# Patient Record
Sex: Female | Born: 1995 | Race: White | Hispanic: No | Marital: Single | Smoking: Never smoker
Health system: Southern US, Community
[De-identification: ages and names within clinical notes are randomized; demographics above are authoritative.]

---

## 2016-11-23 ENCOUNTER — Encounter (HOSPITAL_COMMUNITY): Payer: Self-pay | Admitting: Emergency Medicine

## 2016-11-23 ENCOUNTER — Emergency Department (HOSPITAL_COMMUNITY): Payer: BLUE CROSS/BLUE SHIELD

## 2016-11-23 ENCOUNTER — Emergency Department (HOSPITAL_COMMUNITY)
Admission: EM | Admit: 2016-11-23 | Discharge: 2016-11-24 | Disposition: A | Discharge status: Home / Self Care | Payer: BLUE CROSS/BLUE SHIELD | Attending: Emergency Medicine | Admitting: Emergency Medicine

## 2016-11-23 DIAGNOSIS — J189 Pneumonia, unspecified organism: Secondary | ICD-10-CM

## 2016-11-23 DIAGNOSIS — Z79899 Other long term (current) drug therapy: Secondary | ICD-10-CM | POA: Insufficient documentation

## 2016-11-23 DIAGNOSIS — J181 Lobar pneumonia, unspecified organism: Secondary | ICD-10-CM | POA: Diagnosis not present

## 2016-11-23 DIAGNOSIS — J111 Influenza due to unidentified influenza virus with other respiratory manifestations: Secondary | ICD-10-CM | POA: Insufficient documentation

## 2016-11-23 DIAGNOSIS — R509 Fever, unspecified: Secondary | ICD-10-CM | POA: Diagnosis present

## 2016-11-23 DIAGNOSIS — Z5181 Encounter for therapeutic drug level monitoring: Secondary | ICD-10-CM | POA: Diagnosis not present

## 2016-11-23 DIAGNOSIS — Z7982 Long term (current) use of aspirin: Secondary | ICD-10-CM | POA: Diagnosis not present

## 2016-11-23 DIAGNOSIS — R69 Illness, unspecified: Secondary | ICD-10-CM

## 2016-11-23 LAB — PROTIME-INR
INR: 1.05
PROTHROMBIN TIME: 13.7 s (ref 11.4–15.2)

## 2016-11-23 LAB — CBC WITH DIFFERENTIAL/PLATELET
BASOS PCT: 0 %
Basophils Absolute: 0 10*3/uL (ref 0.0–0.1)
EOS ABS: 0 10*3/uL (ref 0.0–0.7)
Eosinophils Relative: 0 %
HCT: 38.9 % (ref 36.0–46.0)
Hemoglobin: 13.2 g/dL (ref 12.0–15.0)
LYMPHS ABS: 0.5 10*3/uL — AB (ref 0.7–4.0)
Lymphocytes Relative: 8 %
MCH: 27.5 pg (ref 26.0–34.0)
MCHC: 33.9 g/dL (ref 30.0–36.0)
MCV: 81 fL (ref 78.0–100.0)
MONO ABS: 0.5 10*3/uL (ref 0.1–1.0)
MONOS PCT: 10 %
Neutro Abs: 4.5 10*3/uL (ref 1.7–7.7)
Neutrophils Relative %: 82 %
Platelets: 261 10*3/uL (ref 150–400)
RBC: 4.8 MIL/uL (ref 3.87–5.11)
RDW: 13.5 % (ref 11.5–15.5)
WBC: 5.5 10*3/uL (ref 4.0–10.5)

## 2016-11-23 LAB — POC URINE PREG, ED: Preg Test, Ur: NEGATIVE

## 2016-11-23 LAB — I-STAT BETA HCG BLOOD, ED (MC, WL, AP ONLY): I-stat hCG, quantitative: <5 m[IU]/mL (ref <5)

## 2016-11-23 LAB — I-STAT CG4 LACTIC ACID, ED: Lactic Acid, Venous: 2.03 mmol/L (ref 0.5–1.9)

## 2016-11-23 MED ORDER — SODIUM CHLORIDE 0.9 % IV BOLUS (SEPSIS)
30.0000 mL/kg | Freq: Once | INTRAVENOUS | Status: AC
  Administered 2016-11-24: 1389 mL via INTRAVENOUS

## 2016-11-23 NOTE — ED Triage Notes (Signed)
Patient states she has been feeling bad for the past few days. Patient stated she took something for fever but it is still high. Patient states she has not been around anyone sick. No N/V/D.

## 2016-11-23 NOTE — ED Provider Notes (Addendum)
[facility]-EMERGENCY DEPT Provider Note: Shelley Dell, MD, FACEP  CSN: [id number] MRN: [id number] ARRIVAL: 11/23/16 at 2311 ROOM: WA20/[facility]  By signing my name below, I, Marnette Burgess Long, attest that this documentation has been prepared under the direction and in the presence of Paula Libra, MD. Electronically Signed: Marnette Burgess Long, Scribe. 11/23/2016. 11:58 PM.   CHIEF COMPLAINT  Fever   HISTORY OF PRESENT ILLNESS  HPI Comments:  Shelley Leonard is a 21 y.o. female with no pertinent PMHx, who presents to the Emergency Department complaining of fever (TMax 103.1) onset last night. She reports she began feeling bad three days ago, with fever and associated sore throat worsening today PTA. She was seen at an [facility] this afternoon for same who said it was probably viral in etiology but presents to the ED after her temperature began to rise. Pt has additional associated symptoms of a cough, nausea, vomiting, rhinorrhea, generalized myalgias, and a HA. Coughing exacerbates her sore throat. She tried Excedrin Migraine at home around 2:30PM with mild relief of her symptoms. No sick contact with similar symptoms. Pt denies diarrhea.   History reviewed. No pertinent past medical history.  History reviewed. No pertinent surgical history.  History reviewed. No pertinent family history.  Social History  Substance Use Topics  . Smoking status: Never Smoker  . Smokeless tobacco: Never Used  . Alcohol use No    Prior to Admission medications   Medication Sig Start Date End Date Taking? Authorizing Provider  Norgestimate-Ethinyl Estradiol Triphasic (TRINESSA, 28,) 0.18/0.215/0.25 MG-35 MCG tablet Take 1 tablet by mouth daily.   Yes Historical Provider, MD  doxycycline (VIBRAMYCIN) 100 MG capsule Take 1 capsule (100 mg total) by mouth 2 (two) times daily. One po bid x 7 days 11/24/16   Paula Libra, MD  ondansetron (ZOFRAN ODT) 8 MG disintegrating tablet Take 1 tablet (8 mg total) by mouth every 8 (eight)  hours as needed for nausea or vomiting. 11/24/16   Paula Libra, MD    Allergies Percocet [oxycodone-acetaminophen]   REVIEW OF SYSTEMS  Negative except as noted here or in the History of Present Illness.   PHYSICAL EXAMINATION  Initial Vital Signs Blood pressure 131/78, pulse (!) 154, temperature (!) 103.1 F (39.5 C), temperature source Oral, resp. rate 19, height 5' (1.524 m), weight 102 lb (46.3 kg), last menstrual period 11/15/2016, SpO2 96 %.  Examination General: Well-developed, well-nourished female in no acute distress; appearance consistent with age of record HENT: normocephalic; atraumatic; no pharyngeal erythema or exudate  Eyes: pupils equal, round and reactive to light; extraocular muscles intact Neck: supple; no cervical adenopathy Heart: regular rate and rhythm Lungs: clear to auscultation bilaterally; rattly cough Abdomen: soft; nondistended; nontender; no masses or hepatosplenomegaly; bowel sounds present Extremities: No deformity; full range of motion; pulses normal Neurologic: Awake, alert and oriented; motor function intact in all extremities and symmetric; no facial droop Skin: Warm and dry Psychiatric: Normal mood and affect   RESULTS  Summary of this visit's results, reviewed by myself:   EKG Interpretation  Date/Time:    Ventricular Rate:    PR Interval:    QRS Duration:   QT Interval:    QTC Calculation:   R Axis:     Text Interpretation:        Laboratory Studies: Results for orders placed or performed during the hospital encounter of 11/23/16 (from the past 24 hour(s))  Comprehensive metabolic panel     Status: Abnormal   Collection Time: 11/23/16 11:30 PM  Result Value Ref Range   Sodium 133 (L) 135 - 145 mmol/L   Potassium 3.7 3.5 - 5.1 mmol/L   Chloride 104 101 - 111 mmol/L   CO2 19 (L) 22 - 32 mmol/L   Glucose, Bld 105 (H) 65 - 99 mg/dL   BUN 11 6 - 20 mg/dL   Creatinine, Ser 0.98 0.44 - 1.00 mg/dL   Calcium 8.9 8.9 - 11.9 mg/dL     Total Protein 7.9 6.5 - 8.1 g/dL   Albumin 3.8 3.5 - 5.0 g/dL   AST 30 15 - 41 U/L   ALT 13 (L) 14 - 54 U/L   Alkaline Phosphatase 70 38 - 126 U/L   Total Bilirubin 0.4 0.3 - 1.2 mg/dL   GFR calc non Af Amer >60 >60 mL/min   GFR calc Af Amer >60 >60 mL/min   Anion gap 10 5 - 15  CBC with Differential     Status: Abnormal   Collection Time: 11/23/16 11:30 PM  Result Value Ref Range   WBC 5.5 4.0 - 10.5 K/uL   RBC 4.80 3.87 - 5.11 MIL/uL   Hemoglobin 13.2 12.0 - 15.0 g/dL   HCT 14.7 82.9 - 56.2 %   MCV 81.0 78.0 - 100.0 fL   MCH 27.5 26.0 - 34.0 pg   MCHC 33.9 30.0 - 36.0 g/dL   RDW 13.0 86.5 - 78.4 %   Platelets 261 150 - 400 K/uL   Neutrophils Relative % 82 %   Neutro Abs 4.5 1.7 - 7.7 K/uL   Lymphocytes Relative 8 %   Lymphs Abs 0.5 (L) 0.7 - 4.0 K/uL   Monocytes Relative 10 %   Monocytes Absolute 0.5 0.1 - 1.0 K/uL   Eosinophils Relative 0 %   Eosinophils Absolute 0.0 0.0 - 0.7 K/uL   Basophils Relative 0 %   Basophils Absolute 0.0 0.0 - 0.1 K/uL  Protime-INR     Status: None   Collection Time: 11/23/16 11:30 PM  Result Value Ref Range   Prothrombin Time 13.7 11.4 - 15.2 seconds   INR 1.05   I-Stat beta hCG blood, ED     Status: None   Collection Time: 11/23/16 11:43 PM  Result Value Ref Range   I-stat hCG, quantitative <5.0 <5 mIU/mL   Comment 3          I-Stat CG4 Lactic Acid, ED     Status: Abnormal   Collection Time: 11/23/16 11:46 PM  Result Value Ref Range   Lactic Acid, Venous 2.03 (HH) 0.5 - 1.9 mmol/L   Comment NOTIFIED PHYSICIAN   Urinalysis, Routine w reflex microscopic     Status: Abnormal   Collection Time: 11/23/16 11:52 PM  Result Value Ref Range   Color, Urine YELLOW YELLOW   APPearance HAZY (A) CLEAR   Specific Gravity, Urine 1.024 1.005 - 1.030   pH 5.0 5.0 - 8.0   Glucose, UA NEGATIVE NEGATIVE mg/dL   Hgb urine dipstick MODERATE (A) NEGATIVE   Bilirubin Urine NEGATIVE NEGATIVE   Ketones, ur NEGATIVE NEGATIVE mg/dL   Protein, ur 30 (A)  NEGATIVE mg/dL   Nitrite NEGATIVE NEGATIVE   Leukocytes, UA NEGATIVE NEGATIVE   RBC / HPF 6-30 0 - 5 RBC/hpf   WBC, UA 0-5 0 - 5 WBC/hpf   Bacteria, UA NONE SEEN NONE SEEN   Squamous Epithelial / LPF 6-30 (A) NONE SEEN   Mucous PRESENT   Rapid strep screen     Status: None   Collection Time:  11/23/16 11:59 PM  Result Value Ref Range   Streptococcus, Group A Screen (Direct) NEGATIVE NEGATIVE  POC Urine Pregnancy, ED (do NOT order at [facility])     Status: None   Collection Time: 11/24/16 12:00 AM  Result Value Ref Range   Preg Test, Ur NEGATIVE NEGATIVE  I-Stat CG4 Lactic Acid, ED     Status: None   Collection Time: 11/24/16  2:32 AM  Result Value Ref Range   Lactic Acid, Venous 0.61 0.5 - 1.9 mmol/L   Imaging Studies: Dg Chest 2 View  Result Date: 11/23/2016 CLINICAL DATA:  Sepsis and fever EXAM: CHEST  2 VIEW COMPARISON:  None. FINDINGS: The heart size and mediastinal contours are within normal limits. Patchy right middle lobe airspace opacities outlining areas of bronchiectasis are identified. The lungs are hyperinflated. No effusion nor overt or pneumothorax. The visualized skeletal structures are unremarkable. IMPRESSION: Hyperinflated lungs with bronchiectasis in the right middle lobe distribution. Adjacent areas of patchy airspace opacifies concerning for pneumonia and bronchitic change are identified in the right middle lobe. Electronically Signed   By: Tollie Eth M.D.   On: 11/23/2016 23:52    ED COURSE  Nursing notes and initial vitals signs, including pulse oximetry, reviewed.  Vitals:   11/23/16 2321 11/23/16 2322 11/24/16 0157  BP: 131/78  97/63  Pulse: (!) 154  (!) 106  Resp: 19  18  Temp: (!) 103.1 F (39.5 C)  98.9 F (37.2 C)  TempSrc: Oral  Oral  SpO2: 96%  98%  Weight:  102 lb (46.3 kg)   Height:  5' (1.524 m)    2:51 AM Temperature now 99.9. Patient feeling much better after IV fluid bolus and antipyretics. She still complains of a headache but not as severe  as earlier. I do not believe this patient is septic but we will treat for possible pneumonia.She was advised to continue ibuprofen and acetaminophen as needed for fever and headache.  PROCEDURES    ED DIAGNOSES     ICD-9-CM ICD-10-CM   1. Influenza-like illness 799.89 R69   2. Community acquired pneumonia of right middle lobe of lung (HCC) 481 J18.1     I personally performed the services described in this documentation, which was scribed in my presence. The recorded information has been reviewed and is accurate.     Paula Libra, MD 11/24/16 1610    Paula Libra, MD 11/24/16 9604    Paula Libra, MD 11/24/16 [phone]

## 2016-11-24 LAB — COMPREHENSIVE METABOLIC PANEL
ALK PHOS: 70 U/L (ref 38–126)
ALT: 13 U/L — ABNORMAL LOW (ref 14–54)
ANION GAP: 10 (ref 5–15)
AST: 30 U/L (ref 15–41)
Albumin: 3.8 g/dL (ref 3.5–5.0)
BILIRUBIN TOTAL: 0.4 mg/dL (ref 0.3–1.2)
BUN: 11 mg/dL (ref 6–20)
CALCIUM: 8.9 mg/dL (ref 8.9–10.3)
CO2: 19 mmol/L — ABNORMAL LOW (ref 22–32)
Chloride: 104 mmol/L (ref 101–111)
Creatinine, Ser: 0.73 mg/dL (ref 0.44–1.00)
GFR calc Af Amer: >60 mL/min (ref >60)
Glucose, Bld: 105 mg/dL — ABNORMAL HIGH (ref 65–99)
POTASSIUM: 3.7 mmol/L (ref 3.5–5.1)
Sodium: 133 mmol/L — ABNORMAL LOW (ref 135–145)
TOTAL PROTEIN: 7.9 g/dL (ref 6.5–8.1)

## 2016-11-24 LAB — URINALYSIS, ROUTINE W REFLEX MICROSCOPIC
Bacteria, UA: NONE SEEN
Bilirubin Urine: NEGATIVE
GLUCOSE, UA: NEGATIVE mg/dL
KETONES UR: NEGATIVE mg/dL
LEUKOCYTES UA: NEGATIVE
Nitrite: NEGATIVE
PH: 5 (ref 5.0–8.0)
Protein, ur: 30 mg/dL — AB
SPECIFIC GRAVITY, URINE: 1.024 (ref 1.005–1.030)

## 2016-11-24 LAB — RAPID STREP SCREEN (MED CTR MEBANE ONLY): Streptococcus, Group A Screen (Direct): NEGATIVE

## 2016-11-24 LAB — I-STAT CG4 LACTIC ACID, ED: LACTIC ACID, VENOUS: 0.61 mmol/L (ref 0.5–1.9)

## 2016-11-24 MED ORDER — ONDANSETRON 8 MG PO TBDP: 8.0000 mg | ORAL_TABLET | Freq: Three times a day (TID) | ORAL | 0 refills | Status: AC | PRN

## 2016-11-24 MED ORDER — DOXYCYCLINE HYCLATE 100 MG PO TABS
100.0000 mg | ORAL_TABLET | Freq: Once | ORAL | Status: AC
Start: 2016-11-24 — End: 2016-11-24
  Administered 2016-11-24: 100 mg via ORAL
  Filled 2016-11-24: qty 1

## 2016-11-24 MED ORDER — DEXTROSE 5 % IV SOLN
1.0000 g | Freq: Once | INTRAVENOUS | Status: AC
  Administered 2016-11-24: 1 g via INTRAVENOUS
  Filled 2016-11-24: qty 10

## 2016-11-24 MED ORDER — ONDANSETRON HCL 4 MG/2ML IJ SOLN
4.0000 mg | Freq: Once | INTRAMUSCULAR | Status: AC
  Administered 2016-11-24: 4 mg via INTRAVENOUS
  Filled 2016-11-24: qty 2

## 2016-11-24 MED ORDER — IBUPROFEN 200 MG PO TABS
400.0000 mg | ORAL_TABLET | Freq: Once | ORAL | Status: AC
  Administered 2016-11-24: 400 mg via ORAL
  Filled 2016-11-24: qty 2

## 2016-11-24 MED ORDER — DOXYCYCLINE HYCLATE 100 MG PO CAPS: 100.0000 mg | ORAL_CAPSULE | Freq: Two times a day (BID) | ORAL | 0 refills | Status: AC

## 2016-11-24 MED ORDER — ALBUTEROL SULFATE HFA 108 (90 BASE) MCG/ACT IN AERS: 2.0000 | INHALATION_SPRAY | RESPIRATORY_TRACT | Status: DC | PRN

## 2016-11-24 MED ORDER — ACETAMINOPHEN 325 MG PO TABS
650.0000 mg | ORAL_TABLET | Freq: Once | ORAL | Status: AC
  Administered 2016-11-24: 650 mg via ORAL
  Filled 2016-11-24: qty 2

## 2016-11-26 LAB — CULTURE, GROUP A STREP (THRC)

## 2016-11-29 LAB — CULTURE, BLOOD (ROUTINE X 2)
CULTURE: NO GROWTH
Culture: NO GROWTH
SPECIAL REQUESTS: ADEQUATE
SPECIAL REQUESTS: ADEQUATE

## 2017-09-24 IMAGING — CR DG CHEST 2V
2 series · 2 of 2 positions shown · non-contrast
Comparison: None.

CLINICAL DATA: Sepsis and fever

EXAM:
CHEST  2 VIEW

[w chest pa]
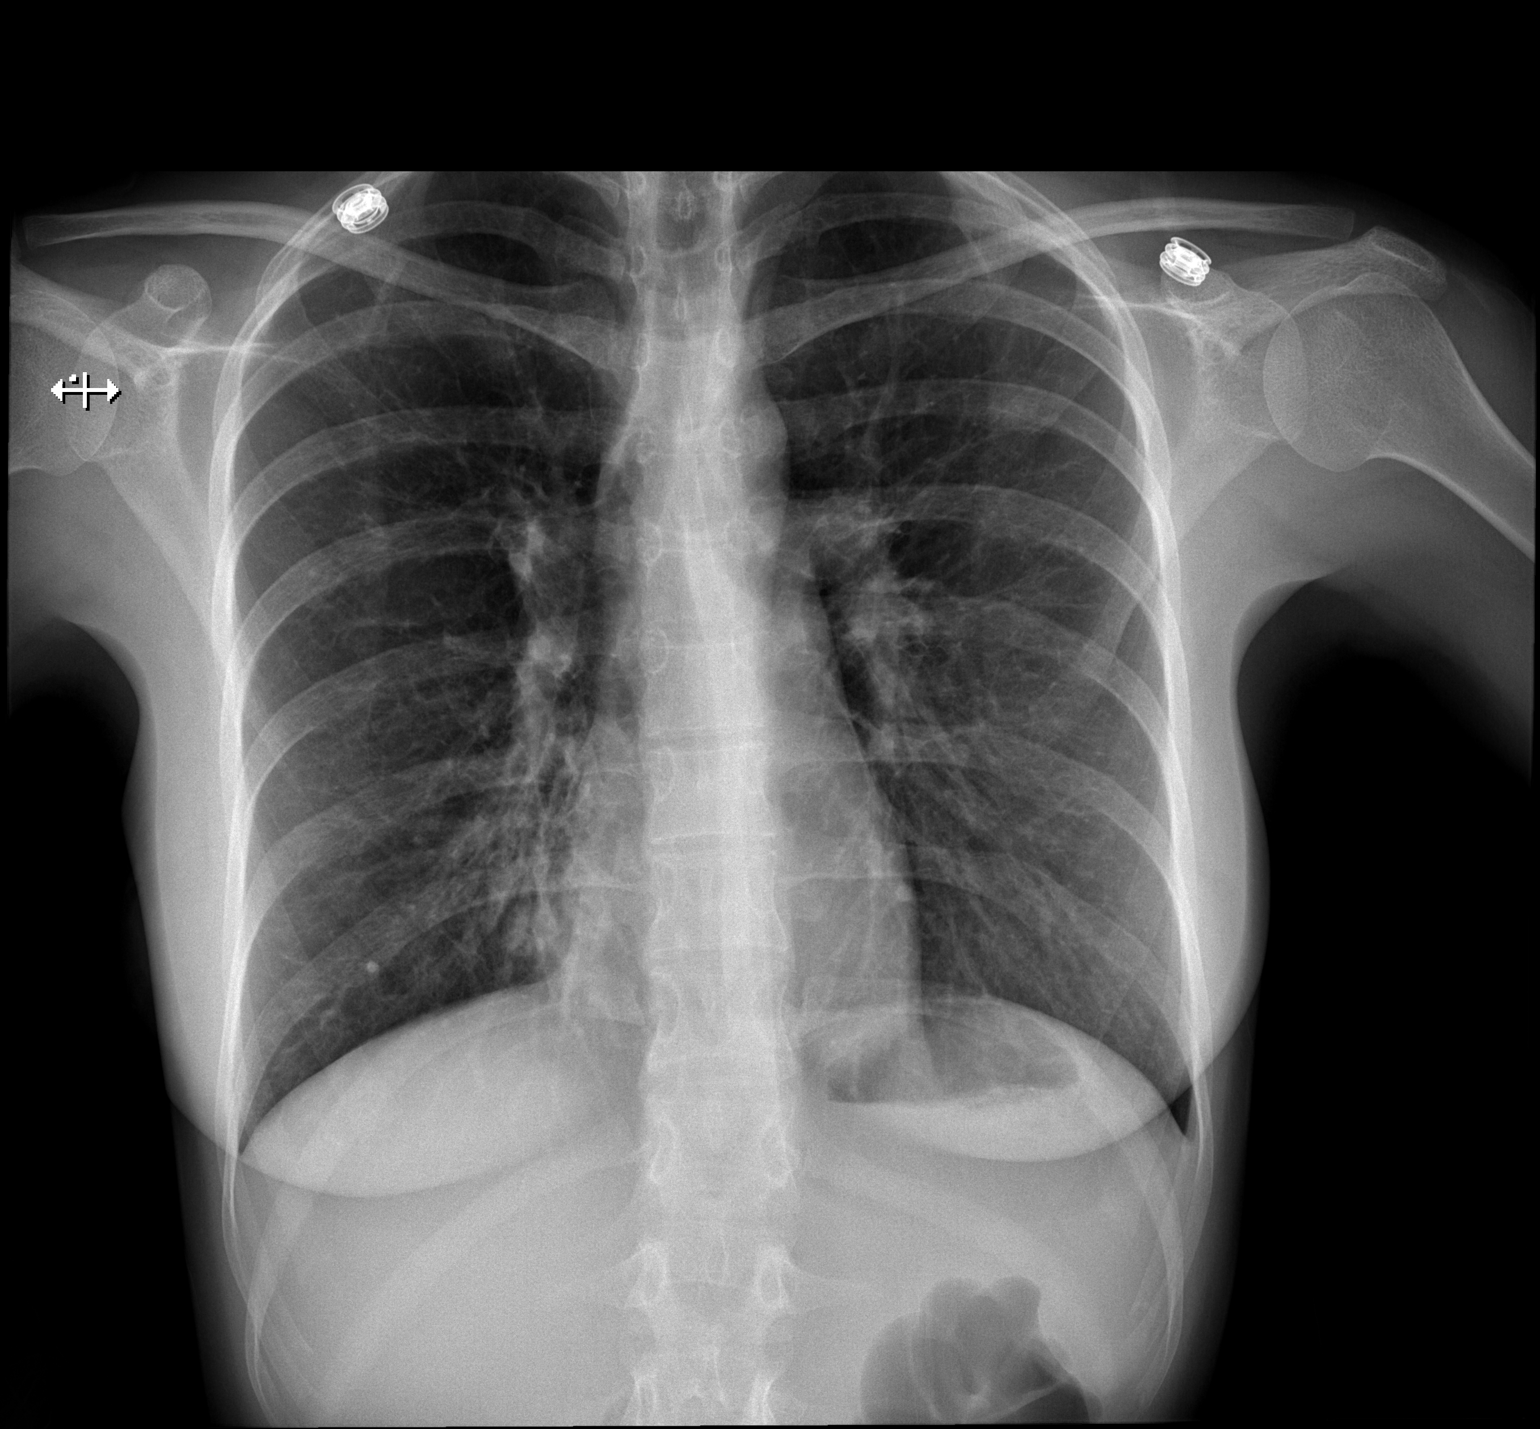

[w chest lat]
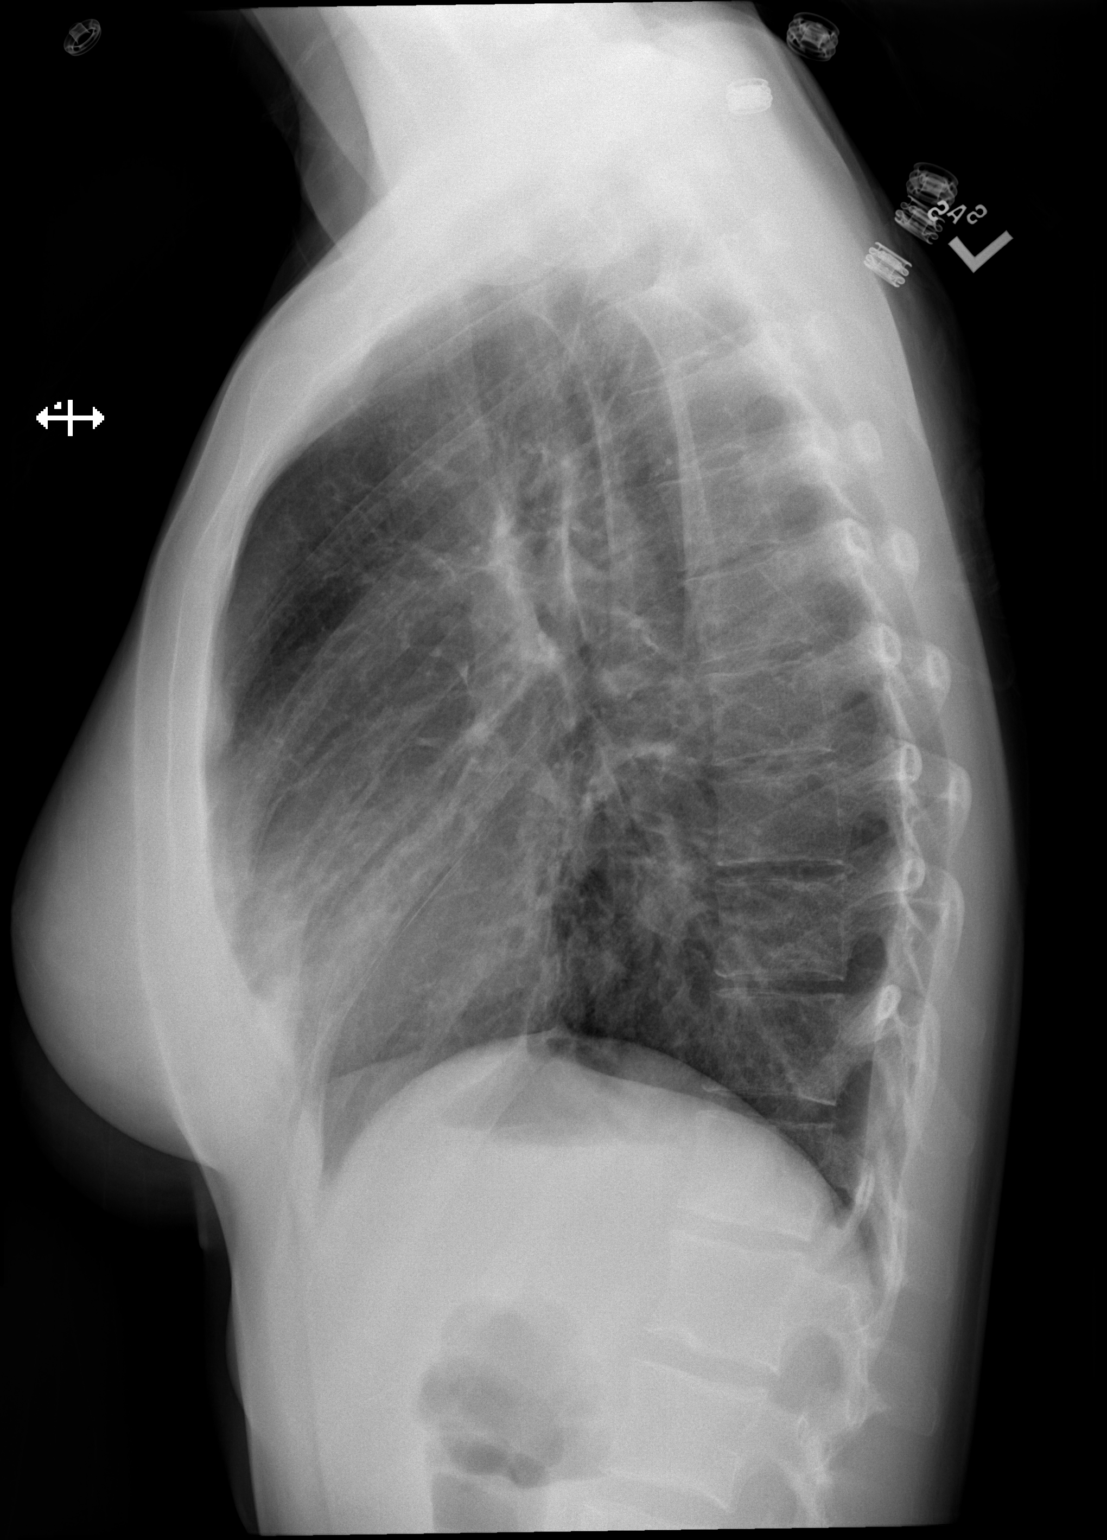

[2 of 2 positions shown; findings below may reference images not displayed]

FINDINGS: The heart size and mediastinal contours are within normal limits.
Patchy right middle lobe airspace opacities outlining areas of
bronchiectasis are identified. The lungs are hyperinflated. No
effusion nor overt or pneumothorax. The visualized skeletal
structures are unremarkable.
IMPRESSION: Hyperinflated lungs with bronchiectasis in the right middle lobe
distribution. Adjacent areas of patchy airspace opacifies concerning
for pneumonia and bronchitic change are identified in the right
middle lobe.
